# Patient Record
Sex: Female | Born: 1985 | Hispanic: Yes | Marital: Married | Smoking: Never smoker
Health system: Southern US, Community
[De-identification: ages and names within clinical notes are randomized; demographics above are authoritative.]

## PROBLEM LIST (undated history)

## (undated) ENCOUNTER — Inpatient Hospital Stay (HOSPITAL_COMMUNITY): Payer: Self-pay

## (undated) DIAGNOSIS — R87619 Unspecified abnormal cytological findings in specimens from cervix uteri: Secondary | ICD-10-CM

## (undated) DIAGNOSIS — IMO0002 Reserved for concepts with insufficient information to code with codable children: Secondary | ICD-10-CM

## (undated) HISTORY — PX: NO PAST SURGERIES: SHX2092

---

## 2012-12-19 NOTE — L&D Delivery Note (Signed)
Operative Delivery Note At 1:25 PM a viable female was delivered via Vaginal, Vacuum Investment banker, operational).  Presentation: vertex; Position: Left,, Occiput,, Anterior; Station: +2. One pull with spont del of fetal head.  Verbal consent: unable to obtain verbal consent due to spanish speaking..  Risks and benefits discussed in detail.  Risks include, but are not limited to the risks of anesthesia, bleeding, infection, damage to maternal tissues, fetal cephalhematoma.  There is also the risk of inability to effect vaginal delivery of the head, or shoulder dystocia that cannot be resolved by established maneuvers, leading to the need for emergency cesarean section.  APGAR:8 ,9 ; weight .   Placenta statusspont vis shultz, 2 vc: , .   Cord:  with the following complications: .  Cord pH: n/a  Anesthesia: Epidural  Instruments: kiwi vac Episiotomy: None Lacerations: 2nd degree Suture Repair: 2.0 vicryl Est. Blood Loss (mL):   Mom to postpartum.  Baby to Couplet care / Skin to Skin.  Cheryl Lester 11/16/2013, 1:39 PM

## 2012-12-19 NOTE — L&D Delivery Note (Signed)
Attestation of Attending Supervision of Advanced Practitioner: Evaluation and management procedures were performed by the PA/NP/CNM/OB Fellow under my supervision/collaboration. Chart reviewed and agree with management and plan.  Khayree Delellis V 11/21/2013 11:24 AM

## 2013-07-04 ENCOUNTER — Inpatient Hospital Stay (HOSPITAL_COMMUNITY): Payer: Self-pay

## 2013-07-04 ENCOUNTER — Inpatient Hospital Stay (HOSPITAL_COMMUNITY)
Admission: AD | Admit: 2013-07-04 | Discharge: 2013-07-04 | Disposition: A | Discharge status: Home / Self Care | Payer: Self-pay | Source: Ambulatory Visit | Attending: Obstetrics & Gynecology | Admitting: Obstetrics & Gynecology

## 2013-07-04 ENCOUNTER — Encounter (HOSPITAL_COMMUNITY): Payer: Self-pay | Admitting: *Deleted

## 2013-07-04 DIAGNOSIS — O26859 Spotting complicating pregnancy, unspecified trimester: Secondary | ICD-10-CM | POA: Insufficient documentation

## 2013-07-04 DIAGNOSIS — O4690 Antepartum hemorrhage, unspecified, unspecified trimester: Secondary | ICD-10-CM

## 2013-07-04 DIAGNOSIS — O4412 Placenta previa with hemorrhage, second trimester: Secondary | ICD-10-CM

## 2013-07-04 DIAGNOSIS — O4692 Antepartum hemorrhage, unspecified, second trimester: Secondary | ICD-10-CM

## 2013-07-04 HISTORY — DX: Reserved for concepts with insufficient information to code with codable children: IMO0002

## 2013-07-04 HISTORY — DX: Unspecified abnormal cytological findings in specimens from cervix uteri: R87.619

## 2013-07-04 LAB — WET PREP, GENITAL
Clue Cells Wet Prep HPF POC: NONE SEEN
Trich, Wet Prep: NONE SEEN
Yeast Wet Prep HPF POC: NONE SEEN

## 2013-07-04 LAB — ABO/RH: ABO/RH(D): O POS

## 2013-07-04 NOTE — MAU Provider Note (Signed)
History     CSN: [id number]  Arrival date and time: 07/04/13 1425   First Provider Initiated Contact with Patient 07/04/13 1456      Chief Complaint  Patient presents with  . Vaginal Bleeding   HPI  Cheryl Lester is 27 y.o. G1P0 presents with complaints of bleeding in early pregnancy.  LMP 03/13/13-normal period.  Planned pregnancy.  Plans care in the Health Dept.  Has bleeding on and off during this pregnancy.  10 days ago woke up with blood in her panties.  Today she saw a blood clot in the toilet.  Denies pain.  SHe is comfortable at this time.  Has not had recent intercourse.   Past Medical History  Diagnosis Date  . Abnormal Pap smear     Past Surgical History  Procedure Laterality Date  . No past surgeries      History reviewed. No pertinent family history.  History  Substance Use Topics  . Smoking status: Never Smoker   . Smokeless tobacco: Not on file  . Alcohol Use: No    Allergies: No Known Allergies  No prescriptions prior to admission    Review of Systems  Constitutional: Negative for fever and chills.  Gastrointestinal: Negative for nausea, vomiting and abdominal pain.  Genitourinary:       + for on and off bleeding.  Neurological: Negative for headaches.   Physical Exam   Blood pressure 122/69, pulse 86, temperature 97.6 F (36.4 C), temperature source Oral, resp. rate 17, height 5\' 5"  (1.651 m), weight 140 lb 3.2 oz (63.594 kg), last menstrual period 03/13/2013, SpO2 100.00%.  Physical Exam  Nursing note and vitals reviewed. Constitutional: She is oriented to person, place, and time. She appears well-developed and well-nourished. No distress.  HENT:  Head: Normocephalic.  Neck: Normal range of motion.  Cardiovascular: Normal rate.   Respiratory: Effort normal.  GI: Soft. She exhibits no distension and no mass. There is no tenderness. There is no rebound and no guarding.  Genitourinary: There is no rash, tenderness or lesion on the  right labia. There is no rash, tenderness or lesion on the left labia. Uterus is enlarged (measures 16-17 wk size). Uterus is not tender. There is bleeding (small amount of brownish discharge) around the vagina. No tenderness around the vagina.  Neurological: She is alert and oriented to person, place, and time.  Skin: Skin is warm and dry.  Psychiatric: She has a normal mood and affect. Her behavior is normal.   Fetal Heart Rate by doppler--155  Results for orders placed during the hospital encounter of 07/04/13 (from the past 24 hour(s))  WET PREP, GENITAL     Status: Abnormal   Collection Time    07/04/13  3:13 PM      Result Value Range   Yeast Wet Prep HPF POC NONE SEEN  NONE SEEN   Trich, Wet Prep NONE SEEN  NONE SEEN   Clue Cells Wet Prep HPF POC NONE SEEN  NONE SEEN   WBC, Wet Prep HPF POC MODERATE (*) NONE SEEN  ABO/RH     Status: None   Collection Time    07/04/13  3:40 PM      Result Value Range   ABO/RH(D) O POS     MAU Course  Procedures  GC/CHL culture to lab  MDM   Assessment and Plan  A:  Intermittent spotting in pregnancy      [redacted]w[redacted]d  Viable pregnancy      Marginal previa  P:  Discussed findings with the patient       STRICT pelvic rest      Return for heavy bleeding     BEGIN prenatal care with health dept   Ronelle Smallman,EVE M 07/04/2013, 3:03 PM

## 2013-07-04 NOTE — MAU Note (Signed)
Patient presents today with vaginal bleeding that she has had throughout off and on during this pregnancy but has gotten heavier in flow since Tuesday. Denies any pain at this time. States was seen by clinic for this current pregnancy and has an upcoming appointment with the health department to start care.

## 2013-07-09 NOTE — MAU Provider Note (Signed)
Attestation of Attending Supervision of Advanced Practitioner (CNM/NP): Evaluation and management procedures were performed by the Advanced Practitioner under my supervision and collaboration. I have reviewed the Advanced Practitioner's note and chart, and I agree with the management and plan.  Rema Lievanos H. 11:01 AM   

## 2013-08-26 LAB — OB RESULTS CONSOLE RUBELLA ANTIBODY, IGM: Rubella: IMMUNE

## 2013-08-26 LAB — OB RESULTS CONSOLE ABO/RH: RH Type: POSITIVE

## 2013-08-26 LAB — OB RESULTS CONSOLE GC/CHLAMYDIA: Gonorrhea: NEGATIVE

## 2013-08-26 LAB — OB RESULTS CONSOLE ANTIBODY SCREEN: Antibody Screen: NEGATIVE

## 2013-08-26 LAB — OB RESULTS CONSOLE HEPATITIS B SURFACE ANTIGEN: Hepatitis B Surface Ag: NEGATIVE

## 2013-08-27 ENCOUNTER — Other Ambulatory Visit (HOSPITAL_COMMUNITY): Payer: Self-pay | Admitting: Physician Assistant

## 2013-08-27 DIAGNOSIS — Z3689 Encounter for other specified antenatal screening: Secondary | ICD-10-CM

## 2013-08-27 DIAGNOSIS — O4402 Placenta previa specified as without hemorrhage, second trimester: Secondary | ICD-10-CM

## 2013-08-30 ENCOUNTER — Ambulatory Visit (HOSPITAL_COMMUNITY)
Admission: RE | Admit: 2013-08-30 | Discharge: 2013-08-30 | Disposition: A | Discharge status: Home / Self Care | Payer: Medicaid Other | Source: Ambulatory Visit | Attending: Anesthesiology | Admitting: Anesthesiology

## 2013-08-30 ENCOUNTER — Other Ambulatory Visit (HOSPITAL_COMMUNITY): Payer: Self-pay | Admitting: Physician Assistant

## 2013-08-30 ENCOUNTER — Ambulatory Visit (HOSPITAL_COMMUNITY)
Admission: RE | Admit: 2013-08-30 | Discharge: 2013-08-30 | Disposition: A | Discharge status: Home / Self Care | Payer: Medicaid Other | Source: Ambulatory Visit | Attending: Physician Assistant | Admitting: Physician Assistant

## 2013-08-30 DIAGNOSIS — O4402 Placenta previa specified as without hemorrhage, second trimester: Secondary | ICD-10-CM

## 2013-08-30 DIAGNOSIS — O4400 Placenta previa specified as without hemorrhage, unspecified trimester: Secondary | ICD-10-CM | POA: Insufficient documentation

## 2013-08-30 DIAGNOSIS — Z3689 Encounter for other specified antenatal screening: Secondary | ICD-10-CM

## 2013-09-19 ENCOUNTER — Other Ambulatory Visit (HOSPITAL_COMMUNITY): Payer: Self-pay | Admitting: Physician Assistant

## 2013-09-19 ENCOUNTER — Other Ambulatory Visit: Payer: Self-pay | Admitting: Obstetrics & Gynecology

## 2013-09-19 DIAGNOSIS — Q270 Congenital absence and hypoplasia of umbilical artery: Secondary | ICD-10-CM

## 2013-10-08 ENCOUNTER — Ambulatory Visit (HOSPITAL_COMMUNITY)
Admission: RE | Admit: 2013-10-08 | Discharge: 2013-10-08 | Disposition: A | Discharge status: Home / Self Care | Payer: Medicaid Other | Source: Ambulatory Visit | Attending: Physician Assistant | Admitting: Physician Assistant

## 2013-10-08 ENCOUNTER — Encounter (HOSPITAL_COMMUNITY): Payer: Self-pay

## 2013-10-08 DIAGNOSIS — O4400 Placenta previa specified as without hemorrhage, unspecified trimester: Secondary | ICD-10-CM | POA: Insufficient documentation

## 2013-10-08 DIAGNOSIS — Q270 Congenital absence and hypoplasia of umbilical artery: Secondary | ICD-10-CM

## 2013-10-08 DIAGNOSIS — IMO0001 Reserved for inherently not codable concepts without codable children: Secondary | ICD-10-CM | POA: Insufficient documentation

## 2013-10-08 DIAGNOSIS — O358XX0 Maternal care for other (suspected) fetal abnormality and damage, not applicable or unspecified: Secondary | ICD-10-CM

## 2013-10-08 NOTE — Consult Note (Signed)
Maternal Fetal Medicine Consultation  Requesting Provider(s): Kathlen Mody, [place]  Reason for consultation: Single umbilical artery  HPI: Cheryl Lester is a 27 yo G2P1001 currently at 37 6/7 weeks - recently noted to have a single umbilical artery on routine ultrasound.  The patient was recently seen for fetal echo at [facility] - results not available, but reports that it was read out as normal.  The patient's prenatal course has also been complicated by placenta previa and vaginal bleeding - has since resolved.  She is without complaints today.  OB History: OB History   Grav Para Term Preterm Abortions TAB SAB Ect Mult Living   2 1 1            Term SVD x 1 without complications  PMH:  Past Medical History  Diagnosis Date  . Abnormal Pap smear     PSH:  Past Surgical History  Procedure Laterality Date  . No past surgeries     Meds: Prenatal vitamins  Allergies: NKDA  FH: Denies family history of birth defects or hereditary disorders.    Soc: denies tobacco or ETOH use  Review of Systems: no vaginal bleeding or cramping/contractions, no LOF, no nausea/vomiting. All other systems reviewed and are negative.  PE: 122/71, 89, 151#  GEN: well-appearing female ABD: gravid, NT  Ultrasound: Single IUP at 29 6/7 weeks Single umbilical artery Fetal anatomy is otherwise within normal limits Normal fetal echo (formal report not available at the time of this study) Fetal growth is appropriate (42nd %tile) Right lateral placenta without previa Normal amniotic fluid volume  A/P: 1) Single IUP at 29 6/7 weeks         2) Placenta previa - resolved         3) Single umbilical artery - although there is an association of aneuploidy and single umbilical artery in the presence of other fetal abnormalities, as an isolated finding this is not felt to confer a higher risk of chromosomal abnormalities.  With a normal fetal echo and no other ultrasound findings, no further  intervention is necessary.  Fetal growth is appropriate on today's study.  Recommend:  Follow up ultrasound in 4 weeks for interval growth - if appropriate at that time, recommend routine OB follow up only.   Thank you for the opportunity to be a part of the care of Cheryl Lester. Please contact our office if we can be of further assistance.   I spent approximately 15 minutes with this patient with over 50% of time spent in face-to-face counseling.  Alpha Gula, MD Maternal-Fetal Medicine

## 2013-11-05 ENCOUNTER — Ambulatory Visit (HOSPITAL_COMMUNITY)
Admission: RE | Admit: 2013-11-05 | Discharge: 2013-11-05 | Disposition: A | Discharge status: Home / Self Care | Payer: Medicaid Other | Source: Ambulatory Visit | Attending: Physician Assistant | Admitting: Physician Assistant

## 2013-11-05 DIAGNOSIS — IMO0001 Reserved for inherently not codable concepts without codable children: Secondary | ICD-10-CM | POA: Insufficient documentation

## 2013-11-05 DIAGNOSIS — O358XX0 Maternal care for other (suspected) fetal abnormality and damage, not applicable or unspecified: Secondary | ICD-10-CM

## 2013-11-06 ENCOUNTER — Other Ambulatory Visit (HOSPITAL_COMMUNITY): Payer: Self-pay | Admitting: Nurse Practitioner

## 2013-11-06 DIAGNOSIS — O288 Other abnormal findings on antenatal screening of mother: Secondary | ICD-10-CM

## 2013-11-12 ENCOUNTER — Ambulatory Visit (HOSPITAL_COMMUNITY)
Admission: RE | Admit: 2013-11-12 | Discharge: 2013-11-12 | Disposition: A | Discharge status: Home / Self Care | Payer: Self-pay | Source: Ambulatory Visit | Attending: Nurse Practitioner | Admitting: Nurse Practitioner

## 2013-11-12 ENCOUNTER — Ambulatory Visit (HOSPITAL_COMMUNITY): Admission: RE | Admit: 2013-11-12 | Payer: Self-pay | Source: Ambulatory Visit

## 2013-11-12 ENCOUNTER — Other Ambulatory Visit (HOSPITAL_COMMUNITY): Payer: Self-pay | Admitting: Nurse Practitioner

## 2013-11-12 DIAGNOSIS — O288 Other abnormal findings on antenatal screening of mother: Secondary | ICD-10-CM

## 2013-11-12 DIAGNOSIS — O4100X0 Oligohydramnios, unspecified trimester, not applicable or unspecified: Secondary | ICD-10-CM | POA: Insufficient documentation

## 2013-11-12 DIAGNOSIS — IMO0001 Reserved for inherently not codable concepts without codable children: Secondary | ICD-10-CM | POA: Insufficient documentation

## 2013-11-16 ENCOUNTER — Encounter (HOSPITAL_COMMUNITY): Payer: Medicaid Other | Admitting: Anesthesiology

## 2013-11-16 ENCOUNTER — Encounter (HOSPITAL_COMMUNITY): Payer: Self-pay

## 2013-11-16 ENCOUNTER — Inpatient Hospital Stay (HOSPITAL_COMMUNITY): Payer: Medicaid Other | Admitting: Anesthesiology

## 2013-11-16 ENCOUNTER — Inpatient Hospital Stay (HOSPITAL_COMMUNITY)
Admission: AD | Admit: 2013-11-16 | Discharge: 2013-11-18 | DRG: 775 | Disposition: A | Discharge status: Home / Self Care | Payer: Medicaid Other | Source: Ambulatory Visit | Attending: Obstetrics & Gynecology | Admitting: Obstetrics & Gynecology

## 2013-11-16 DIAGNOSIS — O42913 Preterm premature rupture of membranes, unspecified as to length of time between rupture and onset of labor, third trimester: Secondary | ICD-10-CM | POA: Diagnosis present

## 2013-11-16 DIAGNOSIS — O4290 Premature rupture of membranes, unspecified as to length of time between rupture and onset of labor, unspecified weeks of gestation: Secondary | ICD-10-CM | POA: Diagnosis present

## 2013-11-16 LAB — TYPE AND SCREEN: Antibody Screen: NEGATIVE

## 2013-11-16 LAB — CBC
HCT: 39.1 % (ref 36.0–46.0)
Hemoglobin: 13.6 g/dL (ref 12.0–15.0)
MCH: 29.8 pg (ref 26.0–34.0)
MCHC: 34.8 g/dL (ref 30.0–36.0)
Platelets: 203 10*3/uL (ref 150–400)
RDW: 13.6 % (ref 11.5–15.5)

## 2013-11-16 LAB — URINE MICROSCOPIC-ADD ON

## 2013-11-16 LAB — OB RESULTS CONSOLE GBS: GBS: NEGATIVE

## 2013-11-16 LAB — URINALYSIS, ROUTINE W REFLEX MICROSCOPIC
Bilirubin Urine: NEGATIVE
Glucose, UA: NEGATIVE mg/dL
Ketones, ur: NEGATIVE mg/dL
Nitrite: NEGATIVE
Specific Gravity, Urine: <1.005 — ABNORMAL LOW (ref 1.005–1.030)
pH: 6 (ref 5.0–8.0)

## 2013-11-16 MED ORDER — LIDOCAINE HCL (PF) 1 % IJ SOLN
30.0000 mL | INTRAMUSCULAR | Status: DC | PRN
  Filled 2013-11-16: qty 30

## 2013-11-16 MED ORDER — LACTATED RINGERS IV BOLUS (SEPSIS)
1000.0000 mL | Freq: Once | INTRAVENOUS | Status: AC
  Administered 2013-11-16: 1000 mL via INTRAVENOUS

## 2013-11-16 MED ORDER — LACTATED RINGERS IV SOLN
INTRAVENOUS | Status: DC
  Administered 2013-11-16: 125 mL/h via INTRAVENOUS
  Administered 2013-11-16: 10:00:00 via INTRAVENOUS

## 2013-11-16 MED ORDER — FENTANYL 2.5 MCG/ML BUPIVACAINE 1/10 % EPIDURAL INFUSION (WH - ANES)
14.0000 mL/h | INTRAMUSCULAR | Status: DC | PRN
  Administered 2013-11-16: 12 mL/h via EPIDURAL
  Filled 2013-11-16: qty 125

## 2013-11-16 MED ORDER — PHENYLEPHRINE 40 MCG/ML (10ML) SYRINGE FOR IV PUSH (FOR BLOOD PRESSURE SUPPORT): 80.0000 ug | PREFILLED_SYRINGE | INTRAVENOUS | Status: DC | PRN

## 2013-11-16 MED ORDER — PHENYLEPHRINE 40 MCG/ML (10ML) SYRINGE FOR IV PUSH (FOR BLOOD PRESSURE SUPPORT)
80.0000 ug | PREFILLED_SYRINGE | INTRAVENOUS | Status: DC | PRN
  Filled 2013-11-16: qty 10

## 2013-11-16 MED ORDER — OXYTOCIN 10 UNIT/ML IJ SOLN
40.0000 [IU] | INTRAVENOUS | Status: DC
  Filled 2013-11-16: qty 4

## 2013-11-16 MED ORDER — LANOLIN HYDROUS EX OINT: TOPICAL_OINTMENT | CUTANEOUS | Status: DC | PRN

## 2013-11-16 MED ORDER — SENNOSIDES-DOCUSATE SODIUM 8.6-50 MG PO TABS
2.0000 | ORAL_TABLET | ORAL | Status: DC
  Administered 2013-11-17 – 2013-11-18 (×2): 2 via ORAL
  Filled 2013-11-16 (×2): qty 2

## 2013-11-16 MED ORDER — IBUPROFEN 600 MG PO TABS: 600.0000 mg | ORAL_TABLET | Freq: Four times a day (QID) | ORAL | Status: DC | PRN

## 2013-11-16 MED ORDER — CITRIC ACID-SODIUM CITRATE 334-500 MG/5ML PO SOLN: 30.0000 mL | ORAL | Status: DC | PRN

## 2013-11-16 MED ORDER — IBUPROFEN 600 MG PO TABS
600.0000 mg | ORAL_TABLET | Freq: Four times a day (QID) | ORAL | Status: DC
  Administered 2013-11-16 – 2013-11-18 (×8): 600 mg via ORAL
  Filled 2013-11-16 (×8): qty 1

## 2013-11-16 MED ORDER — LACTATED RINGERS IV SOLN: 500.0000 mL | Freq: Once | INTRAVENOUS | Status: DC

## 2013-11-16 MED ORDER — OXYTOCIN BOLUS FROM INFUSION: 500.0000 mL | INTRAVENOUS | Status: DC

## 2013-11-16 MED ORDER — ONDANSETRON HCL 4 MG/2ML IJ SOLN: 4.0000 mg | Freq: Four times a day (QID) | INTRAMUSCULAR | Status: DC | PRN

## 2013-11-16 MED ORDER — ACETAMINOPHEN 325 MG PO TABS: 650.0000 mg | ORAL_TABLET | ORAL | Status: DC | PRN

## 2013-11-16 MED ORDER — DIPHENHYDRAMINE HCL 50 MG/ML IJ SOLN: 12.5000 mg | INTRAMUSCULAR | Status: DC | PRN

## 2013-11-16 MED ORDER — BENZOCAINE-MENTHOL 20-0.5 % EX AERO
1.0000 "application " | INHALATION_SPRAY | CUTANEOUS | Status: DC | PRN
  Administered 2013-11-16: 1 via TOPICAL
  Filled 2013-11-16: qty 56

## 2013-11-16 MED ORDER — ZOLPIDEM TARTRATE 5 MG PO TABS: 5.0000 mg | ORAL_TABLET | Freq: Every evening | ORAL | Status: DC | PRN

## 2013-11-16 MED ORDER — LACTATED RINGERS IV SOLN
500.0000 mL | INTRAVENOUS | Status: DC | PRN
  Administered 2013-11-16: 500 mL via INTRAVENOUS

## 2013-11-16 MED ORDER — DIPHENHYDRAMINE HCL 25 MG PO CAPS: 25.0000 mg | ORAL_CAPSULE | Freq: Four times a day (QID) | ORAL | Status: DC | PRN

## 2013-11-16 MED ORDER — EPHEDRINE 5 MG/ML INJ
10.0000 mg | INTRAVENOUS | Status: DC | PRN
  Filled 2013-11-16: qty 4
  Filled 2013-11-16: qty 2

## 2013-11-16 MED ORDER — FENTANYL CITRATE 0.05 MG/ML IJ SOLN
100.0000 ug | INTRAMUSCULAR | Status: DC | PRN
  Administered 2013-11-16: 100 ug via INTRAVENOUS
  Filled 2013-11-16: qty 2

## 2013-11-16 MED ORDER — OXYTOCIN 40 UNITS IN LACTATED RINGERS INFUSION - SIMPLE MED
62.5000 mL/h | INTRAVENOUS | Status: DC
  Administered 2013-11-16: 999 mL/h via INTRAVENOUS
  Filled 2013-11-16: qty 1000

## 2013-11-16 MED ORDER — SODIUM BICARBONATE 8.4 % IV SOLN
INTRAVENOUS | Status: DC | PRN
  Administered 2013-11-16: 5 mL via EPIDURAL

## 2013-11-16 MED ORDER — ONDANSETRON HCL 4 MG PO TABS: 4.0000 mg | ORAL_TABLET | ORAL | Status: DC | PRN

## 2013-11-16 MED ORDER — TETANUS-DIPHTH-ACELL PERTUSSIS 5-2.5-18.5 LF-MCG/0.5 IM SUSP: 0.5000 mL | Freq: Once | INTRAMUSCULAR | Status: DC

## 2013-11-16 MED ORDER — WITCH HAZEL-GLYCERIN EX PADS: 1.0000 "application " | MEDICATED_PAD | CUTANEOUS | Status: DC | PRN

## 2013-11-16 MED ORDER — OXYCODONE-ACETAMINOPHEN 5-325 MG PO TABS: 1.0000 | ORAL_TABLET | ORAL | Status: DC | PRN

## 2013-11-16 MED ORDER — DIBUCAINE 1 % RE OINT: 1.0000 "application " | TOPICAL_OINTMENT | RECTAL | Status: DC | PRN

## 2013-11-16 MED ORDER — OXYTOCIN 40 UNITS IN LACTATED RINGERS INFUSION - SIMPLE MED
1.0000 m[IU]/min | INTRAVENOUS | Status: DC
  Administered 2013-11-16: 4 m[IU]/min via INTRAVENOUS
  Administered 2013-11-16: 2 m[IU]/min via INTRAVENOUS

## 2013-11-16 MED ORDER — PRENATAL MULTIVITAMIN CH
1.0000 | ORAL_TABLET | Freq: Every day | ORAL | Status: DC
  Administered 2013-11-17 – 2013-11-18 (×2): 1 via ORAL
  Filled 2013-11-16 (×2): qty 1

## 2013-11-16 MED ORDER — SIMETHICONE 80 MG PO CHEW: 80.0000 mg | CHEWABLE_TABLET | ORAL | Status: DC | PRN

## 2013-11-16 MED ORDER — TERBUTALINE SULFATE 1 MG/ML IJ SOLN: 0.2500 mg | Freq: Once | INTRAMUSCULAR | Status: DC | PRN

## 2013-11-16 MED ORDER — EPHEDRINE 5 MG/ML INJ
10.0000 mg | INTRAVENOUS | Status: DC | PRN
  Filled 2013-11-16: qty 2

## 2013-11-16 MED ORDER — ONDANSETRON HCL 4 MG/2ML IJ SOLN: 4.0000 mg | INTRAMUSCULAR | Status: DC | PRN

## 2013-11-16 NOTE — Progress Notes (Addendum)
Cheryl Lester is a 27 y.o. G2P1001 at [redacted]w[redacted]d by LMP consistent with 16w [place] admitted for PPROM  Subjective: Comfortable with epidural. Denies pain.  Objective: BP 106/66  Pulse 90  Temp(Src) 98.8 F (37.1 C) (Oral)  Resp 16  Ht 4\' 11"  (1.499 m)  Wt 69.4 kg (153 lb)  BMI 30.89 kg/m2  SpO2 100%  LMP 03/13/2013      FHT:  FHR: 140-150 bpm, variability: moderate,  accelerations:  Abscent,  decelerations:  Absent since last large decel UC:   regular, every 2-3 minutes SVE:   Dilation: 6.5 Effacement (%): 90 Station: -2 Exam by:: Dr Casper Harrison  Labs: Lab Results  Component Value Date   WBC 10.3 11/16/2013   HGB 13.6 11/16/2013   HCT 39.1 11/16/2013   MCV 85.7 11/16/2013   PLT 203 11/16/2013    Assessment / Plan: PPROM, labor progressing. No further decels since last large one around 0445.  Labor: Progressing without augmentatioin. Continue to monitor, minimize checks. Consider IUPC / amnioinfusion if any further decels Preeclampsia:  no signs or symptoms of toxicity Fetal Wellbeing:  Category I currently, good labor pattern Pain Control:  Epidural I/D:  GBS negative Anticipated MOD:  NSVD  Discussed the above with Dr. Emelda Fear.  Bobbye Morton, MD PGY-2, [facility] Family Medicine 11/16/2013, 6:32 AM

## 2013-11-16 NOTE — Progress Notes (Signed)
Faculty Practice OBGYN PGY-2 Interim Note  Pt moved to L&D from MAU. Noted to have deep variable decel at 0445, similar to two previous decels seen in MAU. Improved with starting fluid bolus and repositioning. Pt not in distress otherwise. Discussed with Dr. Emelda Fear. May need to consider IUPC and amnioinfusion. Will continue to monitor closely.  Bobbye Morton, MD PGY-2, [facility] 11/16/2013, 4:58 AM

## 2013-11-16 NOTE — Lactation Note (Signed)
This note was copied from the chart of Cheryl Lester. Lactation Consultation Note Initial visit at 9 hours of age.  [facility] LC resources given and discussed.  Encouraged skin to skin and cue feeding.  Baby has previously had one bottle due to low blood sugar.  MBU RN assisted with breastfeeding in the past 30 minutes, baby not waking up.  Baby has gone longer than 6 hours without feeding, so encouraged a small bottle of formula with slow flow.  Baby sleepy, but takes bottle well.  Encouraged mom to breast first. If baby doesn't cue feed after 3 hours encouraged mom to hold baby skin to skin for 30 minutes.  Instructed on hand expression with colostrum visible. Interpreter called in for instructions.  Mom denies concerns.   Patient Name: Cheryl Delayza Lungren [id number] Date: 11/16/2013 Reason for consult: Initial assessment;Infant < 6lbs;Late preterm infant   Maternal Data Formula Feeding for Exclusion: Yes Reason for exclusion: Mother's choice to formula and breast feed on admission Has patient been taught Hand Expression?: Yes Does the patient have breastfeeding experience prior to this delivery?: Yes  Feeding Feeding Type: Breast Fed Length of feed: 0 min  LATCH Score/Interventions Latch: Too sleepy or reluctant, no latch achieved, no sucking elicited. Intervention(s): Skin to skin;Teach feeding cues  Audible Swallowing: None Intervention(s): Skin to skin;Hand expression  Type of Nipple: Everted at rest and after stimulation  Comfort (Breast/Nipple): Soft / non-tender     Hold (Positioning): Assistance needed to correctly position infant at breast and maintain latch. Intervention(s): Support Pillows;Skin to skin  LATCH Score: 5  Lactation Tools Discussed/Used     Consult Status Consult Status: Follow-up Date: 11/17/13 Follow-up type: In-patient    Jannifer Rodney 11/16/2013, 10:50 PM

## 2013-11-16 NOTE — Anesthesia Preprocedure Evaluation (Signed)

## 2013-11-16 NOTE — MAU Note (Signed)
Via interpreter, pt states that at 11pm she began to have some abdominal pain and spotting. States that during this pregnancy she had a placenta previa, but her doctor told her that it was better.

## 2013-11-16 NOTE — Anesthesia Procedure Notes (Signed)

## 2013-11-16 NOTE — MAU Note (Signed)
0218 Pt had 7 mins decel down to 80-90s. Pt turned to L side and then to R before FHR returned to baseline. Philipp Deputy CNM on unit and aware and viewed strip. Does not want IVFs started at this time

## 2013-11-16 NOTE — H&P (Signed)
Cheryl Lester is a 27 y.o. female G2P1001 at [redacted]w[redacted]d presenting for onset of contractions, vaginal bleeding/bloody show, and PPROM (fern positive in MAU). Pt states contractions and some spotting began around 10 PM 11/28 (she noted some drops of blood in the toilet when she urinated); contractions have become stronger and about every 5 minutes. Pt endorses good fetal movement. Denies frank gush of fluid.  Denies headache / change in vision, other pain, SOB, swelling, N/V, fever/chills, sick contacts.  PNC at [facility]. Notable for placenta previa earlier in pregnancy (resolved on f/u US), known 2-vessel cord as an isolated abnormality (per MFM consult note fetal ultrasound was otherwise normal). Denies other complications, reports compliance with PNV.  Maternal Medical History:  Reason for admission: Rupture of membranes, contractions and vaginal bleeding.   Contractions: Onset was 3-5 hours ago.   Frequency: regular.   Duration is approximately 30 seconds.   Perceived severity is strong.   Every 5 minutes  Fetal activity: Perceived fetal activity is normal.   Last perceived fetal movement was within the past hour.    Prenatal complications: Bleeding and placental abnormality.   Placenta previa on early [place], resolved on f/u 2-vessel cord on [place] around 23w as an isolated abnormality  Prenatal Complications - Diabetes: none.    OB History   Grav Para Term Preterm Abortions TAB SAB Ect Mult Living   2 1 1             Past Medical History  Diagnosis Date  . Abnormal Pap smear    Past Surgical History  Procedure Laterality Date  . No past surgeries     Family History: family history is not on file. Social History:  reports that she has never smoked. She does not have any smokeless tobacco history on file. She reports that she does not drink alcohol or use illicit drugs.   Prenatal Transfer Tool  Maternal Diabetes: No Genetic Screening: Declined (too late) Maternal  Ultrasounds/Referrals: Abnormal:  Findings:   Other:   Placenta previa on early [place], resolved on f/u.  2-vessel cord on [place] in Sept around 23w as an isolated abnormality)  Fetal Ultrasounds or other Referrals:  Referred to Materal Fetal Medicine , Other:  Per MFM consult note, read as normal, formal report not available Maternal Substance Abuse:  No Significant Maternal Medications:  Meds include: Other: PNV Significant Maternal Lab Results:  Lab values include: Other: GBS unknown Other Comments:  None  ROS: as above Review of Systems  All other systems reviewed and are negative.     Blood pressure 120/78, pulse 88, temperature 97.8 F (36.6 C), temperature source Oral, resp. rate 18, height 4' 11.5" (1.511 m), weight 69.673 kg (153 lb 9.6 oz), last menstrual period 03/13/2013, SpO2 100.00%. Maternal Exam:  Uterine Assessment: Contraction strength is moderate.  Contraction duration is 30 seconds. Contraction frequency is regular.   Abdomen: Fetal presentation: vertex  Introitus: Normal vulva. Normal vagina.  Ferning test: positive.  Amniotic fluid character: bloody.  Pelvis: adequate for delivery.   Cervix: Cervix evaluated by sterile speculum exam.   Speculum exam: Bloody show / mucous leaking from cervix  Small amount of pooling mixed with mucous  Fetal Exam Fetal Monitor Review: Mode: ultrasound.   Baseline rate: 140-150.  Pattern: accelerations present and variable decelerations.   Two large variable decelerations in MAU 1. Around 0220, lasting ~5 minutes, nadir ~90 bpm 2. Around 0255, lasting ~3 minutes, nadir ~100 bpm  Fetal State Assessment: Category II - tracings  are indeterminate.     Physical Exam  Nursing note and vitals reviewed. Constitutional: She is oriented to person, place, and time. She appears well-developed and well-nourished. No distress.  Uncomfortable with contractions  HENT:  Head: Normocephalic and atraumatic.  Eyes: EOM are normal.  Neck:  Normal range of motion. Neck supple.  Cardiovascular: Normal rate, regular rhythm and normal heart sounds.   Respiratory: Breath sounds normal. She has no wheezes.  GI: Soft. Bowel sounds are normal. There is no tenderness.  Gravid  Genitourinary: Vagina normal.  Speculum exam as above. Digital cervical exam not performed for PPROM  Musculoskeletal: Normal range of motion. She exhibits no edema.  Neurological: She is alert and oriented to person, place, and time.  Skin: Skin is warm and dry.  Psychiatric: She has a normal mood and affect. Her behavior is normal.    Prenatal labs:  ABO, Rh: --/--/O POS (07/17 1540) Antibody:  negative Rubella:   immune RPR:   non-reactive HBsAg:   negative HIV:   non-reactive GBS:   unknown  Assessment/Plan: [name] is a 27 y.o. G2P1001 at [redacted]w[redacted]d by LMP consistent with 16w ultrasound. -SIUP with PPROM, contracting every 5-7 minutes  #Labor: monitor, minimize cervix checks (plan to check once pt more uncomfortable and at least not until GBS status is known) #Pain: IV pain meds PRN, considering epidural  #FWB: category II with decels as above, will watch closely with continuous monitoring, NICU aware (courtesy call, spoke with Dr. Eric Form) #ID: GBS PCR pending, hold on abx for now #MOF: bottle #MOC: desires Mirena  Above discussed with K. Clelia Croft, CNM, in its entirety.  Bobbye Morton, MD PGY-2, [facility] Family [facility] 11/16/2013, 3:39 AM  I have seen and examined this patient and I agree with the above. GBS neg. Axten Pascucci 10:00 PM 11/17/2013

## 2013-11-17 LAB — CBC
HCT: 36.1 % (ref 36.0–46.0)
Hemoglobin: 12.3 g/dL (ref 12.0–15.0)
MCH: 29.5 pg (ref 26.0–34.0)
MCHC: 34.1 g/dL (ref 30.0–36.0)
MCV: 86.6 fL (ref 78.0–100.0)
RBC: 4.17 MIL/uL (ref 3.87–5.11)

## 2013-11-17 NOTE — Lactation Note (Signed)
This note was copied from the chart of Cheryl Gilbert Galindo-Bravo. Lactation Consultation Note Follow up visit at 27 hours of age, with interpreter.  Mom reports breastfeeding is going well, but she want to give formula until she feels like she has more milk.  Baby is [redacted]w[redacted]d and under 5# with few bottles and latch scores of 9-10 with breast feedings.  Baby has 3 voids and 2 stools in life.  Encouraged mom to post pump with DEBP.  Mom is thinking about renting a pump here, she does not yet have WIC services.  She will let us know if she needs more info on pump rental.  Baby finished a feeding a few minutes ago and is asleep in moms arms.  DEBP set up with instructions on use, frequency (every feeding or 3 hours), cleaning and storage of breast milk.  Referred to baby and me booklet for written info.  Mom denies pain or problems with feeding encouraged her to listen for swallows.She hand expresses prior to latch.  Mom to call for assist as needed.   Patient Name: Cheryl Lester [id number] Date: 11/17/2013     Maternal Data    Feeding    LATCH Score/Interventions Latch: Grasps breast easily, tongue down, lips flanged, rhythmical sucking.  Audible Swallowing: Spontaneous and intermittent  Type of Nipple: Everted at rest and after stimulation  Comfort (Breast/Nipple): Soft / non-tender     Hold (Positioning): No assistance needed to correctly position infant at breast.  LATCH Score: 10  Lactation Tools Discussed/Used     Consult Status      Cheryl Lester, Cheryl Lester 11/17/2013, 5:20 PM

## 2013-11-17 NOTE — Anesthesia Postprocedure Evaluation (Signed)
Anesthesia Post Note  Patient: Cheryl Lester  Procedure(s) Performed: * No procedures listed *  Anesthesia type: Epidural  Patient location: Mother/Baby  Post pain: Pain level controlled  Post assessment: Post-op Vital signs reviewed  Last Vitals: BP 104/60  Pulse 78  Temp(Src) 36.7 C (Oral)  Resp 16  Ht 4\' 11"  (1.499 m)  Wt 153 lb (69.4 kg)  BMI 30.89 kg/m2  SpO2 98%  LMP 03/13/2013  Post vital signs: Reviewed  Level of consciousness: awake  Complications: No apparent anesthesia complications

## 2013-11-17 NOTE — Progress Notes (Signed)
Post Partum Day 1 Subjective: no complaints, up ad lib, voiding and tolerating PO  Objective: Blood pressure 104/60, pulse 78, temperature 98 F (36.7 C), temperature source Oral, resp. rate 16, height 4\' 11"  (1.499 m), weight 153 lb (69.4 kg), last menstrual period 03/13/2013, SpO2 98.00%, unknown if currently breastfeeding.  Physical Exam:  General: alert, cooperative, appears stated age and no distress Lochia: appropriate Uterine Fundus: firm Incision: n/a DVT Evaluation: No evidence of DVT seen on physical exam. Negative Homan's sign. No significant calf/ankle edema.   Recent Labs  11/16/13 0320  HGB 13.6  HCT 39.1    Assessment/Plan: Plan for discharge tomorrow   LOS: 1 day   Wyvonnia Dusky DARLENE 11/17/2013, 6:19 AM

## 2013-11-18 MED ORDER — IBUPROFEN 600 MG PO TABS: 600.0000 mg | ORAL_TABLET | Freq: Four times a day (QID) | ORAL | Status: AC

## 2013-11-18 NOTE — Discharge Summary (Signed)
Obstetric Discharge Summary Reason for Admission: onset of labor and PPROM at 35wk 4d Prenatal Procedures: none Intrapartum Procedures: vacuum Postpartum Procedures: none Complications-Operative and Postpartum: none  Today Ms Cheryl Lester states she is feeling well, and is ready to go home.  She states her only complaint is some minor bilateral ankle edema without erythema or pain.  She states she has passed BM/urine without problems.  She denies chest pain, SOB.  She is currently breast feeding with supplementation of formula and wishes to do Mirena for [facility].    Hemoglobin  Date Value Range Status  11/17/2013 12.3  12.0 - 15.0 g/dL Final     HCT  Date Value Range Status  11/17/2013 36.1  36.0 - 46.0 % Final    Physical Exam:  General: alert, cooperative and no distress Heart: Regular rate, rhythm.  S1, S2 distinct with no gallops or murmurs.   Lungs: CTA bilaterally Abdomen: SNT, BS active Lochia: appropriate Uterine Fundus: firm DVT Evaluation: No evidence of DVT seen on physical exam. Negative Homan's sign. No cords or calf tenderness. Minor edema noted bilaterally  Discharge Diagnoses: PPROM with VAVD for fetal bradycardia  Discharge Information: Date: 11/18/2013 Activity: unrestricted Diet: routine Medications: Ibuprofen Condition: stable Instructions: refer to practice specific booklet Discharge to: home Follow-up Information   Follow up with [facility] [place]. Schedule an appointment as soon as possible for a visit in 6 weeks.   Contact information:   [address] [employer] [place] [address] [phone]      Newborn Data: Live born female  Birth Weight: 4 lb 11.8 oz (2149 g) APGAR: 8, 9  Home with mother.  Loma Newton 11/18/2013, 9:04 AM  I have seen and examined this patient and I agree with the above. Cam Hai 12:28 AM 11/23/2013

## 2013-11-19 ENCOUNTER — Ambulatory Visit: Payer: Self-pay

## 2013-11-19 NOTE — Lactation Note (Addendum)
This note was copied from the chart of Cheryl Lester. Lactation Consultation Note  Patient Name: Cheryl Lester [id number] Date: 11/19/2013 Reason for consult: Follow-up assessment;Infant < 6lbs;Late preterm infant Shanda Bumps, the [place] Interpreter present for visit. Mom was attempting to latch baby in cradle hold. Demonstrated to Mom how to latch baby using cross cradle to obtain more depth with latch. Baby suckled for about 5 minutes then fell asleep. Baby had 14 ml of EBM an hour previous to this BF.  Discussed with Mom how to assess for deep latch, importance of keeping baby close while at the breast, good support, breast massage to help baby transfer milk.  Again reviewed late preterm behaviors and calorie usage with feedings. To conserve calories with BF LC encouraged Mom to BF on 1 breast each feeding, alternating each feeding.  Plan reviewed with Mom again: BF with each feeding 1 breast, alternate each feeding which breast, at least every 2-3 hours. Keep baby actively nursing for 15-20 minutes then supplement with EBM or formula starting with 28 ml tonight increasing each day till her milk comes in, post pump for 15 minutes to encourage milk production. Engorgement care reviewed if needed. Mom reports understanding this plan and feels she can manage this plan. Mom given Similac Neosure 22 cal formula.   Maternal Data    Feeding Feeding Type: Breast Fed Length of feed: 5 min  LATCH Score/Interventions Latch: Repeated attempts needed to sustain latch, nipple held in mouth throughout feeding, stimulation needed to elicit sucking reflex. Intervention(s): Adjust position;Assist with latch;Breast massage;Breast compression  Audible Swallowing: A few with stimulation  Type of Nipple: Everted at rest and after stimulation  Comfort (Breast/Nipple): Filling, red/small blisters or bruises, mild/mod discomfort  Problem noted: Filling  Hold (Positioning): Assistance needed  to correctly position infant at breast and maintain latch. Intervention(s): Breastfeeding basics reviewed;Support Pillows;Position options;Skin to skin  LATCH Score: 6  Lactation Tools Discussed/Used Tools: Pump Breast pump type: Double-Electric Breast Pump   Consult Status Consult Status: Follow-up Date: 11/19/13 Follow-up type: In-patient    Alfred Levins 11/19/2013, 6:03 PM

## 2013-11-19 NOTE — Lactation Note (Signed)
This note was copied from the chart of Boy Jamiaya Galindo-Bravo. Lactation Consultation Note  Patient Name: Boy Adryanna Friedt [id number] Date: 11/19/2013  Mom had baby at the breast when I arrived. Baby was sleepy and did not have a deep latch. Attempted to re-latch but baby was asleep and would not. Mom reports baby has been sleepy at the breast. She has not supplemented since yesterday and has not been pumping. Lots of colostrum present with hand expression. Advised Mom baby needs to be actively nursing for 15-20 minutes each feeding which according to the chart she has been nursing for good periods of time. Discussed with Mom pre-term behaviors and energy usage with feeding. Encouraged Mom to start post pumping after feeding and giving baby supplements since baby has not stooled since 11/17/13. Advised Mom baby may not be tranferring as much EBM as we think although weight loss looks very good. Mom does not have WIC or pump at home. Mom post pumped with DEBP and received 14 ml of EBM which she gave back to the baby with a bottle/slow flow nipple. At this end of this visit baby had large green stool. Plan discussed with Mom for home is to BF each feeding, supplement baby with EBM or formula 28 ml today, the post pump. Will give Mom hand pump for home use. Mom reports she can manage this plan. Asked Mom to call with next feeding for LC to assist with latch and give written plan for home. [employer] [phone] used for this visit.    Maternal Data    Feeding Feeding Type: Breast Milk Length of feed: 6 min  LATCH Score/Interventions                      Lactation Tools Discussed/Used     Consult Status      Alfred Levins 11/19/2013, 2:49 PM

## 2013-11-21 NOTE — H&P (Signed)
Attestation of Attending Supervision of Advanced Practitioner: Evaluation and management procedures were performed by the PA/NP/CNM/OB Fellow under my supervision/collaboration. Chart reviewed and agree with management and plan.  Elliett Guarisco V 11/21/2013 11:21 AM   

## 2014-10-20 ENCOUNTER — Encounter (HOSPITAL_COMMUNITY): Payer: Self-pay

## 2023-01-19 ENCOUNTER — Ambulatory Visit (HOSPITAL_COMMUNITY): Admission: EM | Admit: 2023-01-19 | Discharge: 2023-01-19 | Discharge status: Against Medical Advice | Payer: Self-pay

## 2023-02-10 LAB — GLUCOSE, POCT (MANUAL RESULT ENTRY): Glucose Fasting, POC: 101 mg/dL — AB (ref 70–99)

## 2023-02-10 NOTE — Progress Notes (Signed)
Pt is fasting

## 2023-02-27 ENCOUNTER — Ambulatory Visit: Payer: Self-pay | Admitting: Physician Assistant

## 2023-06-08 ENCOUNTER — Ambulatory Visit: Payer: Self-pay | Admitting: Family Medicine
